# Patient Record
Sex: Female | Born: 1955 | Race: White | Hispanic: No | Marital: Married | Smoking: Never smoker
Health system: Southern US, Community
[De-identification: ages and names within clinical notes are randomized; demographics above are authoritative.]

---

## 2004-04-11 ENCOUNTER — Ambulatory Visit: Payer: Self-pay | Admitting: Gastroenterology

## 2004-05-03 ENCOUNTER — Ambulatory Visit: Payer: Self-pay | Admitting: Unknown Physician Specialty

## 2005-06-28 ENCOUNTER — Ambulatory Visit: Payer: Self-pay | Admitting: Unknown Physician Specialty

## 2006-07-17 ENCOUNTER — Ambulatory Visit: Payer: Self-pay | Admitting: Unknown Physician Specialty

## 2007-08-26 ENCOUNTER — Ambulatory Visit: Payer: Self-pay | Admitting: Unknown Physician Specialty

## 2007-09-25 ENCOUNTER — Ambulatory Visit: Payer: Self-pay | Admitting: Unknown Physician Specialty

## 2008-08-31 ENCOUNTER — Ambulatory Visit: Payer: Self-pay | Admitting: Unknown Physician Specialty

## 2009-10-04 ENCOUNTER — Ambulatory Visit: Payer: Self-pay | Admitting: Obstetrics and Gynecology

## 2010-10-10 ENCOUNTER — Ambulatory Visit: Payer: Self-pay | Admitting: Unknown Physician Specialty

## 2010-11-04 ENCOUNTER — Ambulatory Visit: Payer: Self-pay

## 2011-10-30 ENCOUNTER — Ambulatory Visit: Payer: Self-pay | Admitting: Obstetrics and Gynecology

## 2012-11-04 ENCOUNTER — Ambulatory Visit: Payer: Self-pay | Admitting: Obstetrics and Gynecology

## 2012-12-19 ENCOUNTER — Ambulatory Visit: Payer: Self-pay | Admitting: Unknown Physician Specialty

## 2015-08-25 ENCOUNTER — Ambulatory Visit: Payer: Self-pay | Admitting: Physician Assistant

## 2015-08-25 ENCOUNTER — Encounter: Payer: Self-pay | Admitting: Physician Assistant

## 2015-08-25 VITALS — BP 150/79 | HR 84 | Temp 97.7°F

## 2015-08-25 DIAGNOSIS — J018 Other acute sinusitis: Secondary | ICD-10-CM

## 2015-08-25 MED ORDER — PREDNISONE 10 MG PO TABS: 30.0000 mg | ORAL_TABLET | Freq: Every day | ORAL | Status: DC

## 2015-08-25 MED ORDER — FLUTICASONE PROPIONATE 50 MCG/ACT NA SUSP: 2.0000 | Freq: Every day | NASAL | Status: AC

## 2015-08-25 MED ORDER — AZITHROMYCIN 250 MG PO TABS: ORAL_TABLET | ORAL | Status: DC

## 2015-08-25 NOTE — Progress Notes (Signed)
S: C/o runny nose and congestion for 7 days, head feels "packed",  no fever, chills, cp/sob, v/d; mucus is green and thick, cough is sporadic, c/o of facial and dental pain.   Using otc meds:   O: PE: vitals wnl, nad, perrl eomi, normocephalic, tms dull, nasal mucosa red and swollen, throat injected, neck supple no lymph, lungs c t a, cv rrr, neuro intact  A:  Acute sinusitis   P: zpack, flonase, prednisone 30mg  qd x 3d drink fluids, continue regular meds , use otc meds of choice, return if not improving in 5 days, return earlier if worsening

## 2016-01-25 ENCOUNTER — Other Ambulatory Visit: Payer: Self-pay | Admitting: Obstetrics & Gynecology

## 2016-01-25 DIAGNOSIS — Z1231 Encounter for screening mammogram for malignant neoplasm of breast: Secondary | ICD-10-CM

## 2016-02-07 ENCOUNTER — Encounter: Payer: Self-pay | Admitting: Radiology

## 2016-02-07 ENCOUNTER — Ambulatory Visit
Admission: RE | Admit: 2016-02-07 | Discharge: 2016-02-07 | Disposition: A | Discharge status: Home / Self Care | Payer: Managed Care, Other (non HMO) | Source: Ambulatory Visit | Attending: Obstetrics & Gynecology | Admitting: Obstetrics & Gynecology

## 2016-02-07 DIAGNOSIS — Z1231 Encounter for screening mammogram for malignant neoplasm of breast: Secondary | ICD-10-CM | POA: Insufficient documentation

## 2017-01-28 ENCOUNTER — Other Ambulatory Visit: Payer: Self-pay | Admitting: Obstetrics & Gynecology

## 2017-01-28 DIAGNOSIS — Z1231 Encounter for screening mammogram for malignant neoplasm of breast: Secondary | ICD-10-CM

## 2017-02-12 ENCOUNTER — Ambulatory Visit
Admission: RE | Admit: 2017-02-12 | Discharge: 2017-02-12 | Disposition: A | Discharge status: Home / Self Care | Payer: Managed Care, Other (non HMO) | Source: Ambulatory Visit | Attending: Obstetrics & Gynecology | Admitting: Obstetrics & Gynecology

## 2017-02-12 ENCOUNTER — Encounter (INDEPENDENT_AMBULATORY_CARE_PROVIDER_SITE_OTHER): Payer: Self-pay

## 2017-02-12 DIAGNOSIS — Z1231 Encounter for screening mammogram for malignant neoplasm of breast: Secondary | ICD-10-CM | POA: Diagnosis present

## 2018-01-02 ENCOUNTER — Other Ambulatory Visit: Payer: Self-pay | Admitting: Obstetrics & Gynecology

## 2018-01-02 DIAGNOSIS — Z1231 Encounter for screening mammogram for malignant neoplasm of breast: Secondary | ICD-10-CM

## 2018-02-13 ENCOUNTER — Ambulatory Visit
Admission: RE | Admit: 2018-02-13 | Discharge: 2018-02-13 | Disposition: A | Discharge status: Home / Self Care | Payer: Managed Care, Other (non HMO) | Source: Ambulatory Visit | Attending: Obstetrics & Gynecology | Admitting: Obstetrics & Gynecology

## 2018-02-13 DIAGNOSIS — Z1231 Encounter for screening mammogram for malignant neoplasm of breast: Secondary | ICD-10-CM | POA: Diagnosis not present

## 2018-07-29 ENCOUNTER — Ambulatory Visit: Payer: Managed Care, Other (non HMO) | Attending: Urgent Care

## 2018-07-29 ENCOUNTER — Ambulatory Visit
Admission: EM | Admit: 2018-07-29 | Discharge: 2018-07-29 | Disposition: A | Discharge status: Home / Self Care | Payer: Worker's Compensation

## 2018-07-29 ENCOUNTER — Other Ambulatory Visit: Payer: Self-pay

## 2018-07-29 ENCOUNTER — Encounter: Payer: Self-pay | Admitting: Emergency Medicine

## 2018-07-29 DIAGNOSIS — S92115A Nondisplaced fracture of neck of left talus, initial encounter for closed fracture: Secondary | ICD-10-CM

## 2018-07-29 DIAGNOSIS — W010XXA Fall on same level from slipping, tripping and stumbling without subsequent striking against object, initial encounter: Secondary | ICD-10-CM | POA: Diagnosis not present

## 2018-07-29 DIAGNOSIS — M25572 Pain in left ankle and joints of left foot: Secondary | ICD-10-CM

## 2018-07-29 DIAGNOSIS — L237 Allergic contact dermatitis due to plants, except food: Secondary | ICD-10-CM | POA: Diagnosis not present

## 2018-07-29 MED ORDER — IBUPROFEN 800 MG PO TABS: 800.0000 mg | ORAL_TABLET | Freq: Three times a day (TID) | ORAL | 0 refills | Status: AC | PRN

## 2018-07-29 MED ORDER — TRIAMCINOLONE ACETONIDE 0.1 % EX CREA: 1.0000 "application " | TOPICAL_CREAM | Freq: Two times a day (BID) | CUTANEOUS | 0 refills | Status: AC

## 2018-07-29 MED ORDER — DIPHENHYDRAMINE HCL 2 % EX CREA: TOPICAL_CREAM | Freq: Three times a day (TID) | CUTANEOUS | 0 refills | Status: AC | PRN

## 2018-07-29 NOTE — Discharge Instructions (Addendum)
It was very nice meeting you today in clinic. Thank you for entrusting me with your care.   As discussed, you have a fracture. Wear boot and avoid weight bearing until seen by orthopedics. Use crutches. Use ice to help with swelling. Elevate ankle. Use ibuprofen as needed for pain.   Regarding your rash, I have sent in come topical medications. Please utilize the medications that we discussed. Your prescriptions have been called in to your pharmacy.   Make arrangements to follow up orthopedics. If your symptoms/condition worsens, please seek follow up care either here or in the ER. Please remember, our [facility] providers are "right here with you" when you need [place].   Again, it was my pleasure to take care of you today. Thank you for choosing our clinic. I hope that you start to feel better quickly.   Quentin Mulling, MSN, APRN, FNP-C, CEN Advanced Practice Provider [facility]

## 2018-07-29 NOTE — ED Provider Notes (Signed)
[address], [address] [place], [place] [address] [phone]   Name: Patricia Ross DOB: 16-May-1955 MRN: [id number] CSN: [id number] PCP: System, Provider Not In  Arrival date and time:  07/29/18 1414  Chief Complaint:  Fall; Ankle Pain (left); and Worker's Comp Injury  NOTE: Prior to seeing the patient today, I have reviewed the triage nursing documentation and vital signs. Clinical staff has updated patient's PMH/PSHx, current medication list, and drug allergies/intolerances to ensure comprehensive history available to assist in medical decision making.   History:   HPI: Patricia Ross is a 63 y.o. female who presents today with complaints of acute pain in her RIGHT elbow and LEFT ankle status post a fall today at work.  Patient performing her normal job duties at [employer] when the injury occurred.  Patient notes that she was walking in the hall and slipped on some water that was in the floor at approximately 0945 this morning.  Patient left work and went home where she took Aleve x2 tablets.  Intervention reported to have significantly improved pain to his current level of 4/10.  Since the injury occurred, swelling in the ankle has increased.  Patient notes that it is difficult for her to bend her toes due to her foot being "tight". Elbow has been "fine" and patient notes that it is "just bruised" as she has full AROM.  There are no obvious deformities noted to the affected elbow or ankle. Patient denies previous injury or surgical intervention to her LEFT foot/ankle or RIGHT elbow.  Additionally, patient wanted to be seen for rash associated with contact with poison oak x1 week ago.  Patient has been treating conservatively at home with her husband's topical steroid cream.  Rash initially started on her left upper extremity, however now has spread to the contralateral extremity and to the left popliteal space.  History reviewed. No pertinent past medical  history.  History reviewed. No pertinent surgical history.  Family History  Problem Relation Age of Onset  . Breast cancer Mother 35  . Breast cancer Cousin 10       mat cousin    Social History   Socioeconomic History  . Marital status: Married    Spouse name: Not on file  . Number of children: Not on file  . Years of education: Not on file  . Highest education level: Not on file  Occupational History  . Not on file  Social Needs  . Financial resource strain: Not on file  . Food insecurity:    Worry: Not on file    Inability: Not on file  . Transportation needs:    Medical: Not on file    Non-medical: Not on file  Tobacco Use  . Smoking status: Never Smoker  . Smokeless tobacco: Never Used  Substance and Sexual Activity  . Alcohol use: Not on file  . Drug use: Not on file  . Sexual activity: Not on file  Lifestyle  . Physical activity:    Days per week: Not on file    Minutes per session: Not on file  . Stress: Not on file  Relationships  . Social connections:    Talks on phone: Not on file    Gets together: Not on file    Attends religious service: Not on file    Active member of club or organization: Not on file    Attends meetings of clubs or organizations: Not on file    Relationship status: Not on file  .  Intimate partner violence:    Fear of current or ex partner: Not on file    Emotionally abused: Not on file    Physically abused: Not on file    Forced sexual activity: Not on file  Other Topics Concern  . Not on file  Social History Narrative  . Not on file    There are no active problems to display for this patient.   Home Medications:    Current Meds  Medication Sig  . cyanocobalamin (,VITAMIN B-12,) 1000 MCG/ML injection   . fluticasone (FLONASE) 50 MCG/ACT nasal spray Place 2 sprays into both nostrils daily.    Allergies:   Penicillins and Sulfa antibiotics  Review of Systems (ROS): Review of Systems  Constitutional: Negative for  chills and fever.  Respiratory: Negative for cough and shortness of breath.   Cardiovascular: Negative for chest pain and palpitations.  Musculoskeletal:       RIGHT elbow pain. LEFT ankle pain/swelling  Skin: Positive for rash.  Neurological: Negative for weakness and numbness.     Physical Exam:  Triage Vital Signs ED Triage Vitals  Enc Vitals Group     BP 07/29/18 1428 (!) 155/88     Pulse Rate 07/29/18 1428 93     Resp 07/29/18 1428 16     Temp 07/29/18 1428 98.6 F (37 C)     Temp Source 07/29/18 1428 Oral     SpO2 07/29/18 1428 98 %     Weight 07/29/18 1425 145 lb (65.8 kg)     Height 07/29/18 1425  (1.6 m)     Head Circumference --      Peak Flow --      Pain Score 07/29/18 1424 4     Pain Loc --      Pain Edu? --      Excl. in GC? --     Physical Exam  Constitutional: She is oriented to person, place, and time and well-developed, well-nourished, and in no distress.  HENT:  Head: Normocephalic and atraumatic.  Mouth/Throat: Oropharynx is clear and moist and mucous membranes are normal.  Neck: Normal range of motion.  Cardiovascular: Normal rate, regular rhythm, normal heart sounds and intact distal pulses. Exam reveals no gallop and no friction rub.  No murmur heard. Pulmonary/Chest: Effort normal and breath sounds normal. No respiratory distress. She has no wheezes. She has no rales.  Musculoskeletal:     Right elbow: She exhibits normal range of motion, no swelling and no deformity. Tenderness (generalized) found.     Left ankle: She exhibits decreased range of motion and swelling. She exhibits no ecchymosis, no deformity and normal pulse. Tenderness. Lateral malleolus and AITFL tenderness found. Achilles tendon normal.     Comments: Foot warm and dry. PT/DP pulses equal and normal when compared contralaterally.  Capillary refill normal.  Neurological: She is alert and oriented to person, place, and time.  Skin: Skin is warm and dry. Rash noted. Rash is  maculopapular (BILATERAL biceps area and LEFT popliteal fossa).  Psychiatric: Mood, affect and judgment normal.  Nursing note and vitals reviewed.    Urgent Care Treatments / Results:   LABS: PLEASE NOTE: all labs that were ordered this encounter are listed, however only abnormal results are displayed. Labs Reviewed - No data to display  EKG: -None  RADIOLOGY: Dg Ankle Complete Left  Result Date: 07/29/2018 CLINICAL DATA:  Slip and fall in water today. Left ankle pain. Initial encounter. EXAM: LEFT ANKLE COMPLETE - 3+  VIEW COMPARISON:  None. FINDINGS: There is a tibiotalar joint effusion. A small nondisplaced fracture of the dorsal neck of the distal talus is identified. No other bony abnormality is seen. IMPRESSION: Small, nondisplaced fracture of the distal aspect of the dorsal neck of the talus with an associated tibiotalar joint effusion. Electronically Signed   By: Drusilla Kannerhomas  Dalessio M.D.   On: 07/29/2018 15:04    PRODEDURES: Procedures  MEDICATIONS RECEIVED THIS VISIT: Medications - No data to display  PERTINENT CLINICAL COURSE NOTES/UPDATES:   Initial Impression / Assessment and Plan / Urgent Care Course:    Patricia Ross is a 63 y.o. female who [facility] with complaints of Fall; Ankle Pain (left); and Worker's Comp Injury  Pertinent labs & imaging results that were available during my care of the patient were personally reviewed by me and considered in my medical decision making (see lab/imaging section of note for values and interpretations).  Exam reveals minimal tenderness to RIGHT elbow with some slight bruising.  Patient exhibits full AROM of the elbow.  No concern for fracture.  She has significant pain and swelling in her LEFT ankle following a slip injury on a wet floor.  Diagnostic plain films reveal a small nondisplaced fracture of the dorsal neck of the distal talus.  Patient placed in a cam walker and provided with crutches.  Discussed  nonweightbearing status until advised otherwise by orthopedics.  Patient notes that Aleve has managed her pain effectively, thus she refuses any interventions for pain.  Patient needs to see orthopedics.  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  Patient encouraged to follow-up with her employer today to discuss needing to be seen for further evaluation and management.  Patient was provided with the name of Dr. Leron CroakJohn Poggi as a local provider.   Regarding her rash, will provide patient with topical TAC to help with the inflammation.  Rash has improved significantly since onset, thus will avoid systemic steroids.  Patient provided with a prescription for diphenhydramine topical to use as needed for pruritus.  Additionally, she may use oral diphenhydramine as needed.  I have reviewed the follow up and strict return precautions for any new or worsening symptoms. Patient is aware of symptoms that would be deemed urgent/emergent, and would thus require further evaluation either here or in the emergency department. At the time of discharge, she verbalized understanding and consent with the discharge plan as it was reviewed with her. All questions were fielded by provider and/or clinic staff prior to patient discharge.    Final Clinical Impressions(s) / Urgent Care Diagnoses:   Final diagnoses:  Closed nondisplaced fracture of neck of left talus, initial encounter  Contact dermatitis due to poison oak    New Prescriptions:   Meds ordered this encounter  Medications  . ibuprofen (ADVIL) 800 MG tablet    Sig: Take 1 tablet (800 mg total) by mouth every 8 (eight) hours as needed.    Dispense:  21 tablet    Refill:  0  . triamcinolone cream (KENALOG) 0.1 %    Sig: Apply 1 application topically 2 (two) times daily.    Dispense:  30 g    Refill:  0  . diphenhydrAMINE (BENADRYL) 2 % cream    Sig: Apply topically 3 (three) times daily as  needed for itching.    Dispense:  30 g    Refill:  0  Controlled Substance Prescriptions:  [place] Controlled Substance Registry consulted? Not Applicable  NOTE: This note was prepared using Dragon dictation software along with smaller phrase technology. Despite my best ability to proofread, there is the potential that transcriptional errors may still occur from this process, and are completely unintentional.      Verlee Monte, NP 07/29/18 2022

## 2018-07-29 NOTE — ED Triage Notes (Signed)
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  Patient states that she twisted her left ankle.  Patient c/o left ankle pain and right elbow pain.

## 2019-01-12 ENCOUNTER — Other Ambulatory Visit: Payer: Self-pay | Admitting: Obstetrics & Gynecology

## 2019-01-12 DIAGNOSIS — Z1231 Encounter for screening mammogram for malignant neoplasm of breast: Secondary | ICD-10-CM

## 2019-02-16 ENCOUNTER — Ambulatory Visit
Admission: RE | Admit: 2019-02-16 | Discharge: 2019-02-16 | Disposition: A | Discharge status: Home / Self Care | Payer: Managed Care, Other (non HMO) | Source: Ambulatory Visit | Attending: Obstetrics & Gynecology | Admitting: Obstetrics & Gynecology

## 2019-02-16 ENCOUNTER — Other Ambulatory Visit: Payer: Self-pay

## 2019-02-16 DIAGNOSIS — Z1231 Encounter for screening mammogram for malignant neoplasm of breast: Secondary | ICD-10-CM | POA: Insufficient documentation

## 2020-01-05 ENCOUNTER — Other Ambulatory Visit: Payer: Self-pay | Admitting: Obstetrics & Gynecology

## 2020-01-05 DIAGNOSIS — Z1231 Encounter for screening mammogram for malignant neoplasm of breast: Secondary | ICD-10-CM

## 2020-02-17 ENCOUNTER — Ambulatory Visit
Admission: RE | Admit: 2020-02-17 | Discharge: 2020-02-17 | Disposition: A | Discharge status: Home / Self Care | Payer: Managed Care, Other (non HMO) | Source: Ambulatory Visit | Attending: Obstetrics & Gynecology | Admitting: Obstetrics & Gynecology

## 2020-02-17 ENCOUNTER — Other Ambulatory Visit: Payer: Self-pay

## 2020-02-17 DIAGNOSIS — Z1231 Encounter for screening mammogram for malignant neoplasm of breast: Secondary | ICD-10-CM

## 2021-01-16 ENCOUNTER — Other Ambulatory Visit: Payer: Self-pay | Admitting: Obstetrics and Gynecology

## 2021-01-16 DIAGNOSIS — Z1231 Encounter for screening mammogram for malignant neoplasm of breast: Secondary | ICD-10-CM

## 2021-02-21 ENCOUNTER — Ambulatory Visit
Admission: RE | Admit: 2021-02-21 | Discharge: 2021-02-21 | Disposition: A | Discharge status: Home / Self Care | Payer: Medicare Other | Source: Ambulatory Visit | Attending: Obstetrics and Gynecology | Admitting: Obstetrics and Gynecology

## 2021-02-21 ENCOUNTER — Other Ambulatory Visit: Payer: Self-pay

## 2021-02-21 DIAGNOSIS — Z1231 Encounter for screening mammogram for malignant neoplasm of breast: Secondary | ICD-10-CM

## 2022-01-08 ENCOUNTER — Other Ambulatory Visit: Payer: Self-pay

## 2022-01-08 DIAGNOSIS — Z1231 Encounter for screening mammogram for malignant neoplasm of breast: Secondary | ICD-10-CM

## 2022-02-22 ENCOUNTER — Ambulatory Visit: Payer: Medicare Other

## 2022-02-27 ENCOUNTER — Ambulatory Visit (INDEPENDENT_AMBULATORY_CARE_PROVIDER_SITE_OTHER): Payer: Medicare Other

## 2022-02-27 ENCOUNTER — Encounter: Payer: Self-pay | Admitting: Emergency Medicine

## 2022-02-27 ENCOUNTER — Ambulatory Visit
Admission: EM | Admit: 2022-02-27 | Discharge: 2022-02-27 | Disposition: A | Discharge status: Home / Self Care | Payer: Medicare Other

## 2022-02-27 DIAGNOSIS — R052 Subacute cough: Secondary | ICD-10-CM | POA: Diagnosis not present

## 2022-02-27 DIAGNOSIS — R030 Elevated blood-pressure reading, without diagnosis of hypertension: Secondary | ICD-10-CM | POA: Diagnosis not present

## 2022-02-27 DIAGNOSIS — J209 Acute bronchitis, unspecified: Secondary | ICD-10-CM

## 2022-02-27 DIAGNOSIS — R0989 Other specified symptoms and signs involving the circulatory and respiratory systems: Secondary | ICD-10-CM

## 2022-02-27 DIAGNOSIS — R059 Cough, unspecified: Secondary | ICD-10-CM

## 2022-02-27 MED ORDER — PROMETHAZINE-DM 6.25-15 MG/5ML PO SYRP: 5.0000 mL | ORAL_SOLUTION | Freq: Four times a day (QID) | ORAL | 0 refills | Status: AC | PRN

## 2022-02-27 NOTE — ED Provider Notes (Signed)
[facility]    CSN: [id number] Arrival date & time: 02/27/22  0853      History   Chief Complaint Chief Complaint  Patient presents with   Cough    HPI Patricia Ross is a 67 y.o. female presenting for cough and congestion x 5 weeks. She says cough is productive. No fevers. Denies nasal congestion, sore throat, sinus pain, chest pain, wheezing or SOB. No known COVID exposure.  Reports that multiple family members have been sick with the same cough and congestion for a couple of weeks.  She says they seem to have improved but she feels a little worse over the past 2 weeks.  Has been taking OTC cough medications.  Reports that she is sleeping okay at night.  No other complaints.  Patient has history of hyperlipidemia.  She has had borderline elevated blood pressures and this is being monitored through her PCP.  HPI  History reviewed. No pertinent past medical history.  There are no problems to display for this patient.   History reviewed. No pertinent surgical history.  OB History   No obstetric history on file.      Home Medications    Prior to Admission medications   Medication Sig Start Date End Date Taking? Authorizing Provider  Calcium Carb-Cholecalciferol 500-10 MG-MCG TABS Take by mouth. 11/24/21 11/24/22 Yes [provider]  Cholecalciferol 50 MCG (2000 UT) CAPS Take by mouth. 11/24/21  Yes [provider]  promethazine-dextromethorphan (PROMETHAZINE-DM) 6.25-15 MG/5ML syrup Take 5 mLs by mouth 4 (four) times daily as needed. 02/27/22  Yes Danton Clap, PA-C  alendronate (FOSAMAX) 70 MG tablet Take 70 mg by mouth once a week.    [provider]  cyanocobalamin (,VITAMIN B-12,) 1000 MCG/ML injection  07/29/18   [provider]  diphenhydrAMINE (BENADRYL) 2 % cream Apply topically 3 (three) times daily as needed for itching. 07/29/18   Karen Kitchens, NP  fluticasone (FLONASE) 50 MCG/ACT nasal spray Place 2 sprays into both  nostrils daily. 08/25/15   Fisher, Linden Dolin, PA-C  ibuprofen (ADVIL) 800 MG tablet Take 1 tablet (800 mg total) by mouth every 8 (eight) hours as needed. 07/29/18   Karen Kitchens, NP  lovastatin (MEVACOR) 10 MG tablet Take 10 mg by mouth daily.    [provider]  meloxicam (MOBIC) 7.5 MG tablet Take 7.5 mg by mouth 2 (two) times daily.    [provider]  prednisoLONE acetate (PRED FORTE) 1 % ophthalmic suspension SMARTSIG:1 Drop(s) Left Eye    [provider]  triamcinolone cream (KENALOG) 0.1 % Apply 1 application topically 2 (two) times daily. 07/29/18   Karen Kitchens, NP    Family History Family History  Problem Relation Age of Onset   Breast cancer Mother 93   Breast cancer Cousin 40       mat cousin    Social History Social History   Tobacco Use   Smoking status: Never   Smokeless tobacco: Never  Vaping Use   Vaping Use: Never used  Substance Use Topics   Alcohol use: Not Currently   Drug use: Never     Allergies   Penicillins and Sulfa antibiotics   Review of Systems Review of Systems  Constitutional:  Positive for fatigue. Negative for chills, diaphoresis and fever.  HENT:  Positive for congestion. Negative for ear pain, rhinorrhea, sinus pressure, sinus pain and sore throat.   Respiratory:  Positive for cough. Negative for shortness of breath  and wheezing.   Cardiovascular:  Negative for chest pain.  Gastrointestinal:  Negative for abdominal pain, nausea and vomiting.  Musculoskeletal:  Negative for arthralgias and myalgias.  Skin:  Negative for rash.  Neurological:  Negative for weakness and headaches.  Hematological:  Negative for adenopathy.     Physical Exam Triage Vital Signs ED Triage Vitals  Enc Vitals Group     BP 02/27/22 1005 (!) 168/95     Pulse Rate 02/27/22 1005 (!) 107     Resp 02/27/22 1005 18     Temp 02/27/22 1005 97.7 F (36.5 C)     Temp Source 02/27/22 1005 Oral     SpO2 02/27/22 1005 96 %     Weight --       Height --      Head Circumference --      Peak Flow --      Pain Score 02/27/22 1003 0     Pain Loc --      Pain Edu? --      Excl. in GC? --    No data found.  Updated Vital Signs BP (!) 168/95 (BP Location: Left Arm)   Pulse (!) 107   Temp 97.7 F (36.5 C) (Oral)   Resp 18   SpO2 96%      Physical Exam Vitals and nursing note reviewed.  Constitutional:      General: She is not in acute distress.    Appearance: Normal appearance. She is not ill-appearing or toxic-appearing.     Comments: Patient coughed several times during exam.  HENT:     Head: Normocephalic and atraumatic.     Nose: Nose normal.     Mouth/Throat:     Mouth: Mucous membranes are moist.     Pharynx: Oropharynx is clear.  Eyes:     General: No scleral icterus.       Right eye: No discharge.        Left eye: No discharge.     Conjunctiva/sclera: Conjunctivae normal.  Cardiovascular:     Rate and Rhythm: Regular rhythm. Tachycardia present.     Heart sounds: Normal heart sounds.  Pulmonary:     Effort: Pulmonary effort is normal. No respiratory distress.     Breath sounds: Normal breath sounds. No wheezing, rhonchi or rales.  Musculoskeletal:     Cervical back: Neck supple.  Skin:    General: Skin is dry.  Neurological:     General: No focal deficit present.     Mental Status: She is alert. Mental status is at baseline.     Motor: No weakness.     Gait: Gait normal.  Psychiatric:        Mood and Affect: Mood normal.        Behavior: Behavior normal.        Thought Content: Thought content normal.      UC Treatments / Results  Labs (all labs ordered are listed, but only abnormal results are displayed) Labs Reviewed - No data to display  EKG   Radiology DG Chest 2 View  Result Date: 02/27/2022 CLINICAL DATA:  Cough.  Congestion. EXAM: CHEST - 2 VIEW COMPARISON:  None Available. FINDINGS: No pleural effusion. No pneumothorax. No focal airspace opacity. Normal cardiac and mediastinal  contours. There is exaggerated thoracic kyphosis. Vertebral body heights are maintained. No displaced rib fractures. Visualized upper abdomen is unremarkable. IMPRESSION: No focal airspace opacity. Electronically Signed   By: Elige Radon.D.  On: 02/27/2022 10:46    Procedures Procedures (including critical care time)  Medications Ordered in UC Medications - No data to display  Initial Impression / Assessment and Plan / UC Course  I have reviewed the triage vital signs and the nursing notes.  Pertinent labs & imaging results that were available during my care of the patient were reviewed by me and considered in my medical decision making (see chart for details).   67 y/o female presents for cough and congestion x 5 weeks. No fever, sinus pain, chest pain, SOB.  Vitals are normal and stable and she is in no acute distress.  On exam her nose is clear.  Throat is clear.  Chest clear to auscultation and heart regular rhythm.  BP is elevated at 168/95.  Recheck is about the same.  Advised her to keep a log at home of the blood pressure and continue to follow-up PCP.  She says she has blood pressures in the 865H systolic at home.  CXR ordered, given duration of cough, to assess for possible pneumonia.  X-ray normal.  Discussed result patient.  Suspect viral bronchitis.  Sent Promethazine DM to pharmacy.  Encouraged plenty rest and fluids.  Advised that she should be feeling better in the next to 10 days but if she is not or develops fever or shortness of breath she should be seen again and reexamined.   Final Clinical Impressions(s) / UC Diagnoses   Final diagnoses:  Acute bronchitis, unspecified organism  Subacute cough  Elevated blood pressure reading     Discharge Instructions      -No evidence of pneumonia on your x-ray. - You have viral bronchitis which can last for 4 to 6 weeks. - I sent cough medicine to the pharmacy.  Increase rest and fluids. - Need to be seen again if you  develop new or worsening symptoms.  If you develop a fever or have shortness of breath need to be seen again right away. -Keep log of BP and f/u with PCP     ED Prescriptions     Medication Sig Dispense Auth. Provider   promethazine-dextromethorphan (PROMETHAZINE-DM) 6.25-15 MG/5ML syrup Take 5 mLs by mouth 4 (four) times daily as needed. 118 mL Danton Clap, PA-C      PDMP not reviewed this encounter.   Danton Clap, PA-C 02/27/22 1057

## 2022-02-27 NOTE — Discharge Instructions (Addendum)
-  No evidence of pneumonia on your x-ray. - You have viral bronchitis which can last for 4 to 6 weeks. - I sent cough medicine to the pharmacy.  Increase rest and fluids. - Need to be seen again if you develop new or worsening symptoms.  If you develop a fever or have shortness of breath need to be seen again right away. -Keep log of BP and f/u with PCP

## 2022-02-27 NOTE — ED Triage Notes (Signed)
Pt presents with a cough and chest congestion x 5 weeks.

## 2022-02-28 ENCOUNTER — Ambulatory Visit: Payer: Self-pay

## 2022-03-12 ENCOUNTER — Ambulatory Visit: Payer: Medicare Other

## 2022-03-21 ENCOUNTER — Ambulatory Visit
Admission: RE | Admit: 2022-03-21 | Discharge: 2022-03-21 | Disposition: A | Discharge status: Home / Self Care | Payer: Medicare Other | Source: Ambulatory Visit | Attending: Obstetrics and Gynecology | Admitting: Obstetrics and Gynecology

## 2022-03-21 DIAGNOSIS — Z1231 Encounter for screening mammogram for malignant neoplasm of breast: Secondary | ICD-10-CM | POA: Insufficient documentation

## 2022-10-04 IMAGING — MG MM DIGITAL SCREENING BILAT W/ TOMO AND CAD
6 of 10 series · 6 of 30 positions shown · non-contrast
Comparison: Previous exam(s).

CLINICAL DATA: Screening.

EXAM:
DIGITAL SCREENING BILATERAL MAMMOGRAM WITH TOMOSYNTHESIS AND CAD
TECHNIQUE: Bilateral screening digital craniocaudal and mediolateral oblique
mammograms were obtained. Bilateral screening digital breast
tomosynthesis was performed. The images were evaluated with
computer-aided detection.

[L MLO synth-2D (1 of 2)]
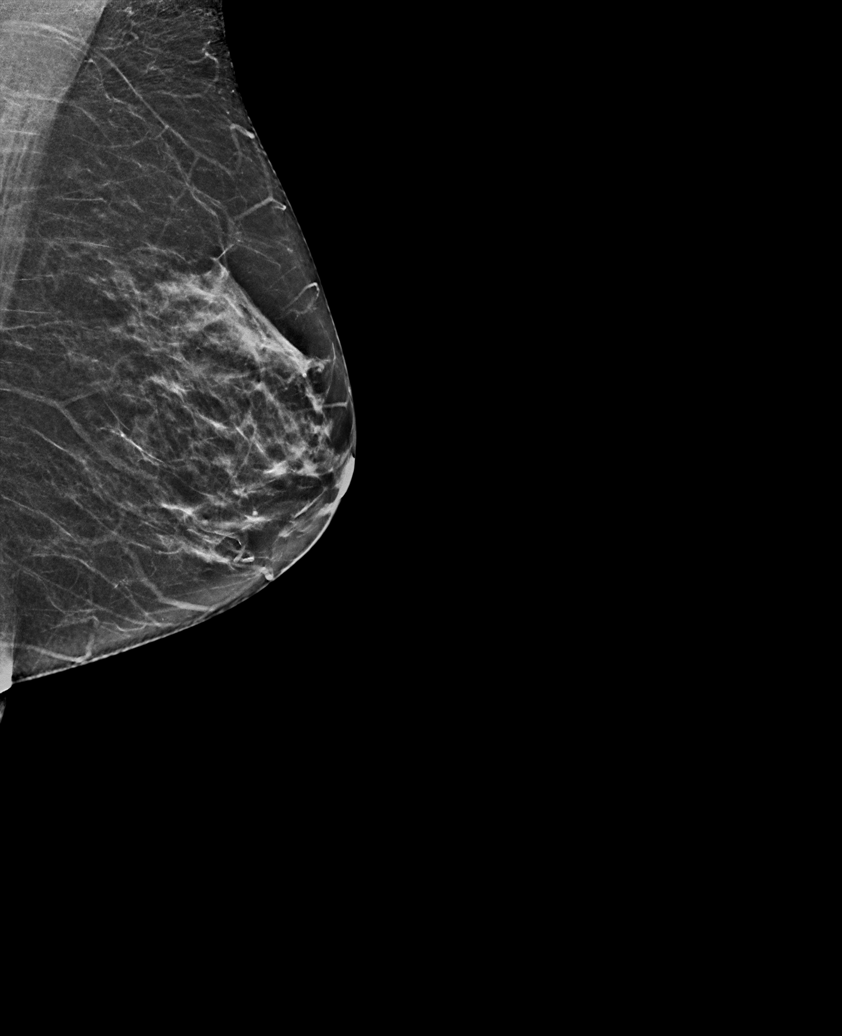

[R MLO synth-2D]
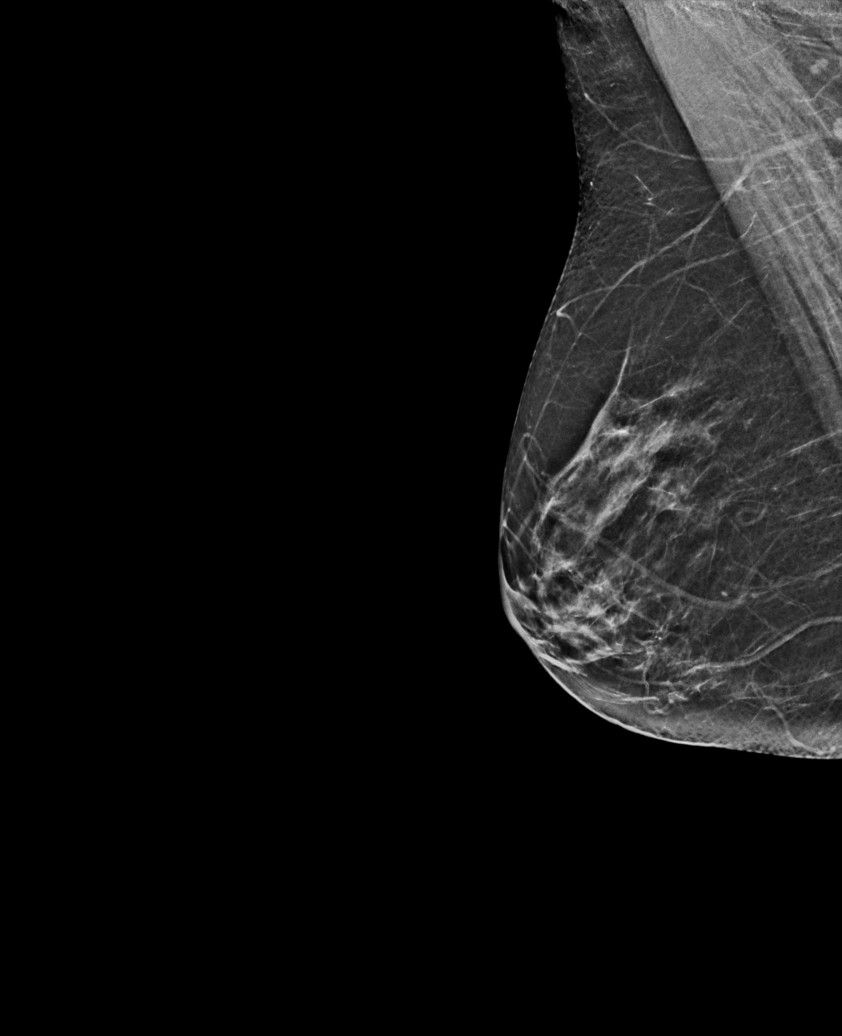

[L CC synth-2D]
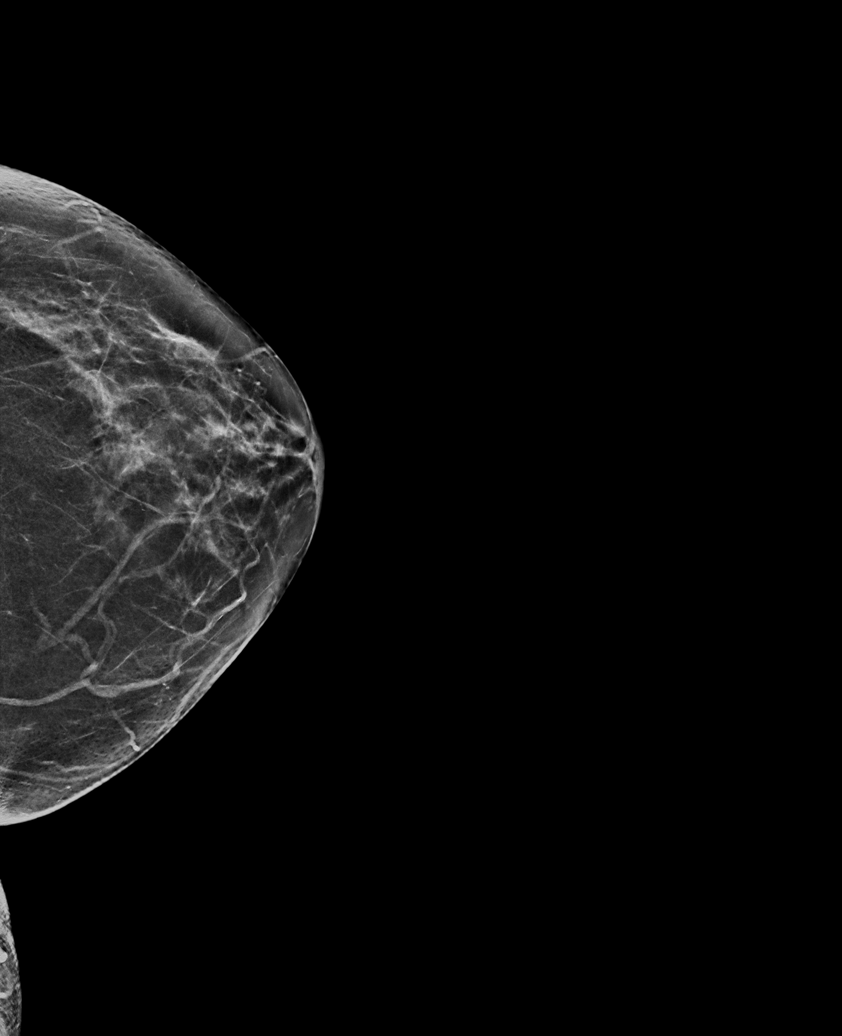

[R CC synth-2D]
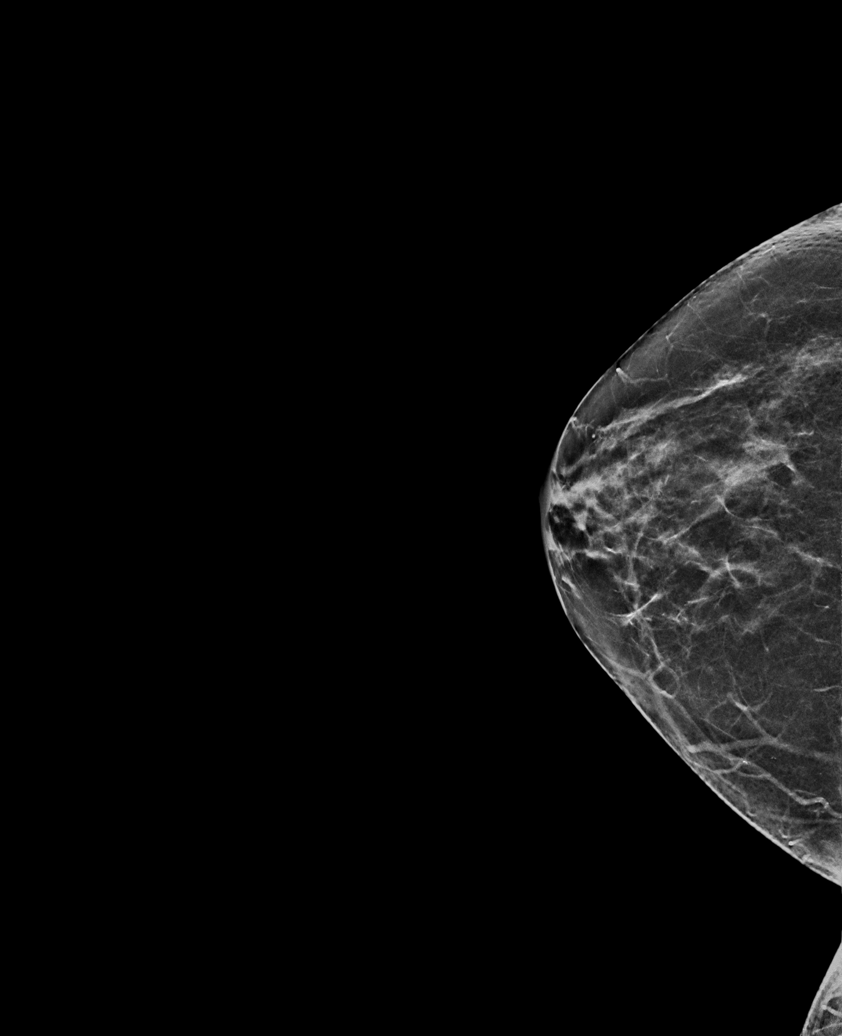

[L MLO synth-2D (2 of 2)]
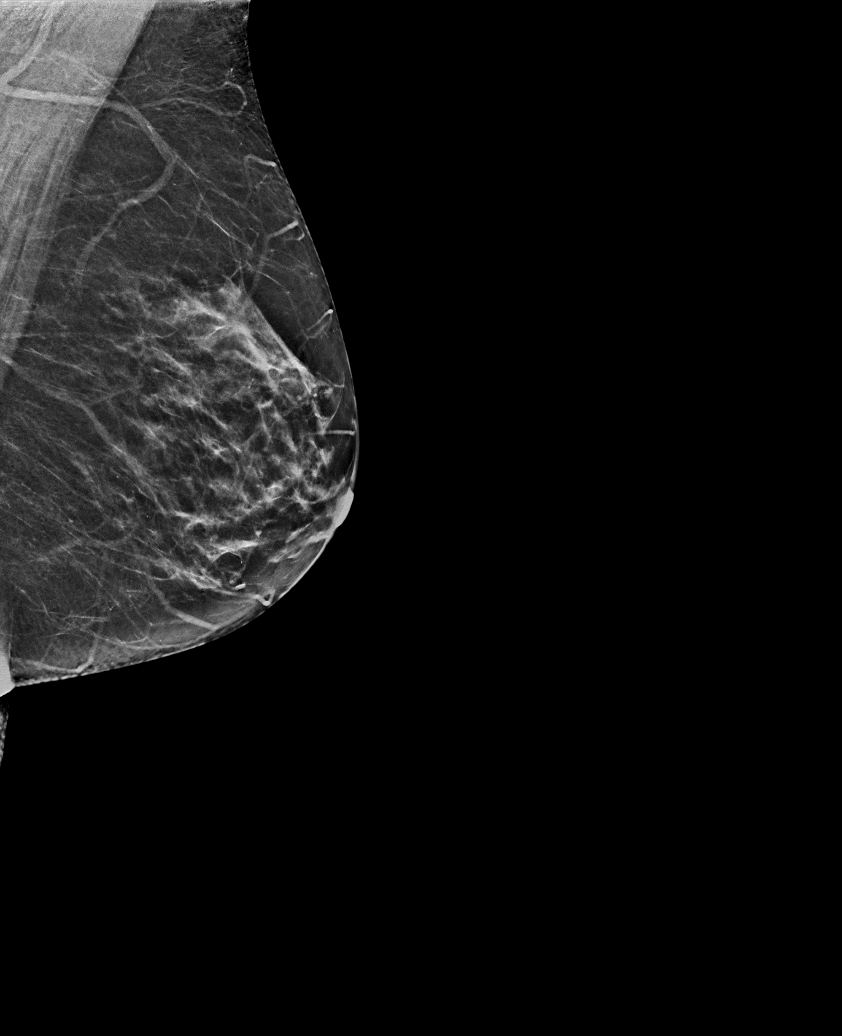

[R CC tomo · tomo slice 27/54.0]
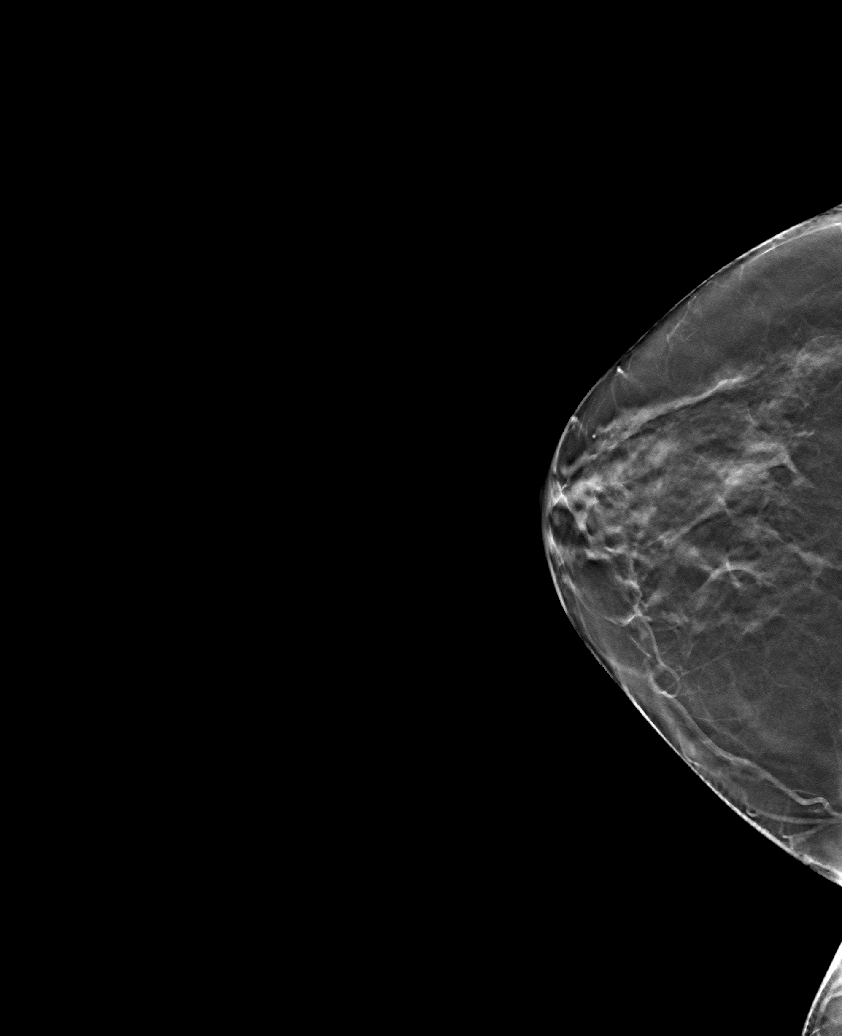

[6 of 30 positions shown; findings below may reference images not displayed]

ACR Breast Density Category b: There are scattered areas of
fibroglandular density.
FINDINGS: There are no findings suspicious for malignancy.
IMPRESSION: No mammographic evidence of malignancy. A result letter of this
screening mammogram will be mailed directly to the patient.

RECOMMENDATION:
Screening mammogram in one year. (Code:51-O-LD2)

BI-RADS CATEGORY  1: Negative.

## 2022-11-12 ENCOUNTER — Other Ambulatory Visit: Payer: Self-pay | Admitting: Gerontology

## 2022-11-12 DIAGNOSIS — Z1231 Encounter for screening mammogram for malignant neoplasm of breast: Secondary | ICD-10-CM

## 2023-03-25 ENCOUNTER — Ambulatory Visit
Admission: RE | Admit: 2023-03-25 | Discharge: 2023-03-25 | Disposition: A | Discharge status: Home / Self Care | Payer: Medicare Other | Source: Ambulatory Visit | Attending: Gerontology | Admitting: Gerontology

## 2023-03-25 DIAGNOSIS — Z1231 Encounter for screening mammogram for malignant neoplasm of breast: Secondary | ICD-10-CM | POA: Insufficient documentation

## 2023-05-27 ENCOUNTER — Ambulatory Visit

## 2023-05-27 DIAGNOSIS — K640 First degree hemorrhoids: Secondary | ICD-10-CM | POA: Diagnosis not present

## 2023-05-27 DIAGNOSIS — Z09 Encounter for follow-up examination after completed treatment for conditions other than malignant neoplasm: Secondary | ICD-10-CM | POA: Diagnosis present

## 2023-05-27 DIAGNOSIS — K5730 Diverticulosis of large intestine without perforation or abscess without bleeding: Secondary | ICD-10-CM | POA: Diagnosis not present

## 2023-05-27 DIAGNOSIS — K635 Polyp of colon: Secondary | ICD-10-CM | POA: Diagnosis not present

## 2023-05-27 DIAGNOSIS — Z860101 Personal history of adenomatous and serrated colon polyps: Secondary | ICD-10-CM | POA: Diagnosis not present

## 2024-02-13 ENCOUNTER — Other Ambulatory Visit: Payer: Self-pay | Admitting: Obstetrics and Gynecology

## 2024-02-13 DIAGNOSIS — Z1231 Encounter for screening mammogram for malignant neoplasm of breast: Secondary | ICD-10-CM

## 2024-03-25 ENCOUNTER — Ambulatory Visit
Admission: RE | Admit: 2024-03-25 | Discharge: 2024-03-25 | Disposition: A | Discharge status: Home / Self Care | Source: Ambulatory Visit | Attending: Obstetrics and Gynecology | Admitting: Obstetrics and Gynecology

## 2024-03-25 DIAGNOSIS — Z1231 Encounter for screening mammogram for malignant neoplasm of breast: Secondary | ICD-10-CM | POA: Insufficient documentation
